# Patient Record
Sex: Male | Born: 1990 | Race: White | Hispanic: No | Marital: Single | Smoking: Never smoker
Health system: Southern US, Community
[De-identification: ages and names within clinical notes are randomized; demographics above are authoritative.]

## PROBLEM LIST (undated history)

## (undated) DIAGNOSIS — F419 Anxiety disorder, unspecified: Secondary | ICD-10-CM

---
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
## 2014-08-25 ENCOUNTER — Other Ambulatory Visit: Payer: Self-pay | Admitting: Internal Medicine

## 2014-08-25 ENCOUNTER — Ambulatory Visit
Admission: RE | Admit: 2014-08-25 | Discharge: 2014-08-25 | Disposition: A | Discharge status: Home / Self Care | Payer: No Typology Code available for payment source | Source: Ambulatory Visit | Attending: Internal Medicine | Admitting: Internal Medicine

## 2014-08-25 DIAGNOSIS — M79641 Pain in right hand: Secondary | ICD-10-CM

## 2014-08-30 ENCOUNTER — Other Ambulatory Visit (HOSPITAL_COMMUNITY): Payer: Self-pay | Admitting: Orthopedic Surgery

## 2014-08-30 DIAGNOSIS — S62316A Displaced fracture of base of fifth metacarpal bone, right hand, initial encounter for closed fracture: Secondary | ICD-10-CM

## 2014-08-31 ENCOUNTER — Ambulatory Visit (HOSPITAL_COMMUNITY)
Admission: RE | Admit: 2014-08-31 | Discharge: 2014-08-31 | Disposition: A | Discharge status: Home / Self Care | Payer: Self-pay | Source: Ambulatory Visit | Attending: Orthopedic Surgery | Admitting: Orthopedic Surgery

## 2014-08-31 ENCOUNTER — Ambulatory Visit (HOSPITAL_COMMUNITY): Payer: Self-pay

## 2014-08-31 DIAGNOSIS — S62316A Displaced fracture of base of fifth metacarpal bone, right hand, initial encounter for closed fracture: Secondary | ICD-10-CM

## 2014-08-31 DIAGNOSIS — X58XXXD Exposure to other specified factors, subsequent encounter: Secondary | ICD-10-CM | POA: Insufficient documentation

## 2014-08-31 DIAGNOSIS — S62316D Displaced fracture of base of fifth metacarpal bone, right hand, subsequent encounter for fracture with routine healing: Secondary | ICD-10-CM | POA: Insufficient documentation

## 2016-11-13 IMAGING — CR DG HAND COMPLETE 3+V*R*
3 series · 3 of 3 positions shown · non-contrast
Comparison: None in PACs

CLINICAL DATA: Punching injury 3 weeks ago with persistent pain
along the fifth metacarpal

EXAM:
RIGHT HAND - COMPLETE 3+ VIEW

[x hand pa right]
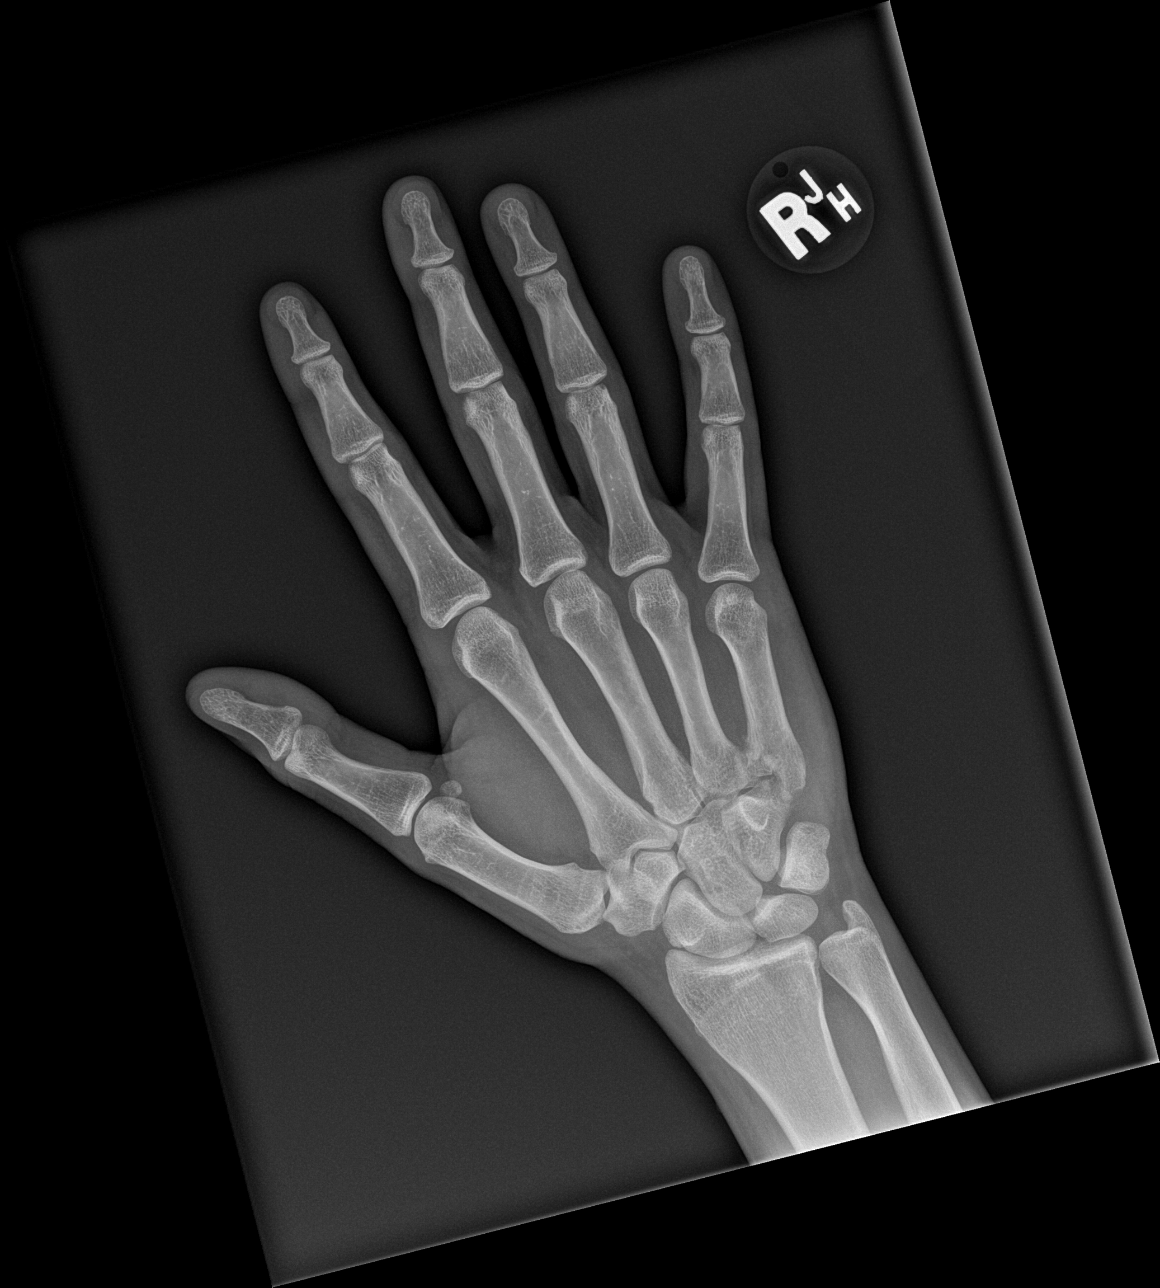

[x hand obl right]
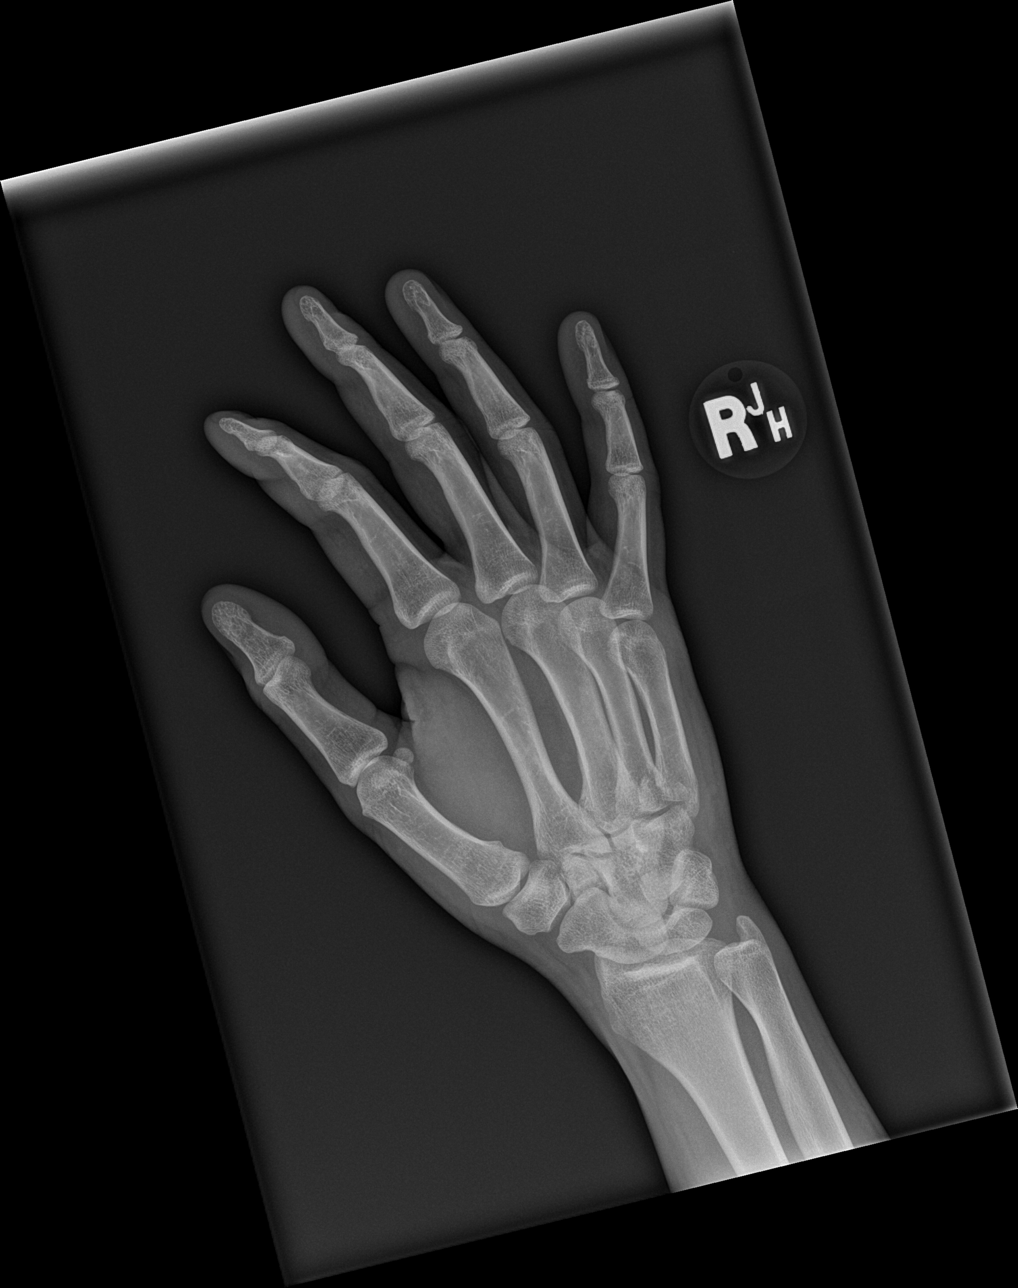

[x hand lat right]
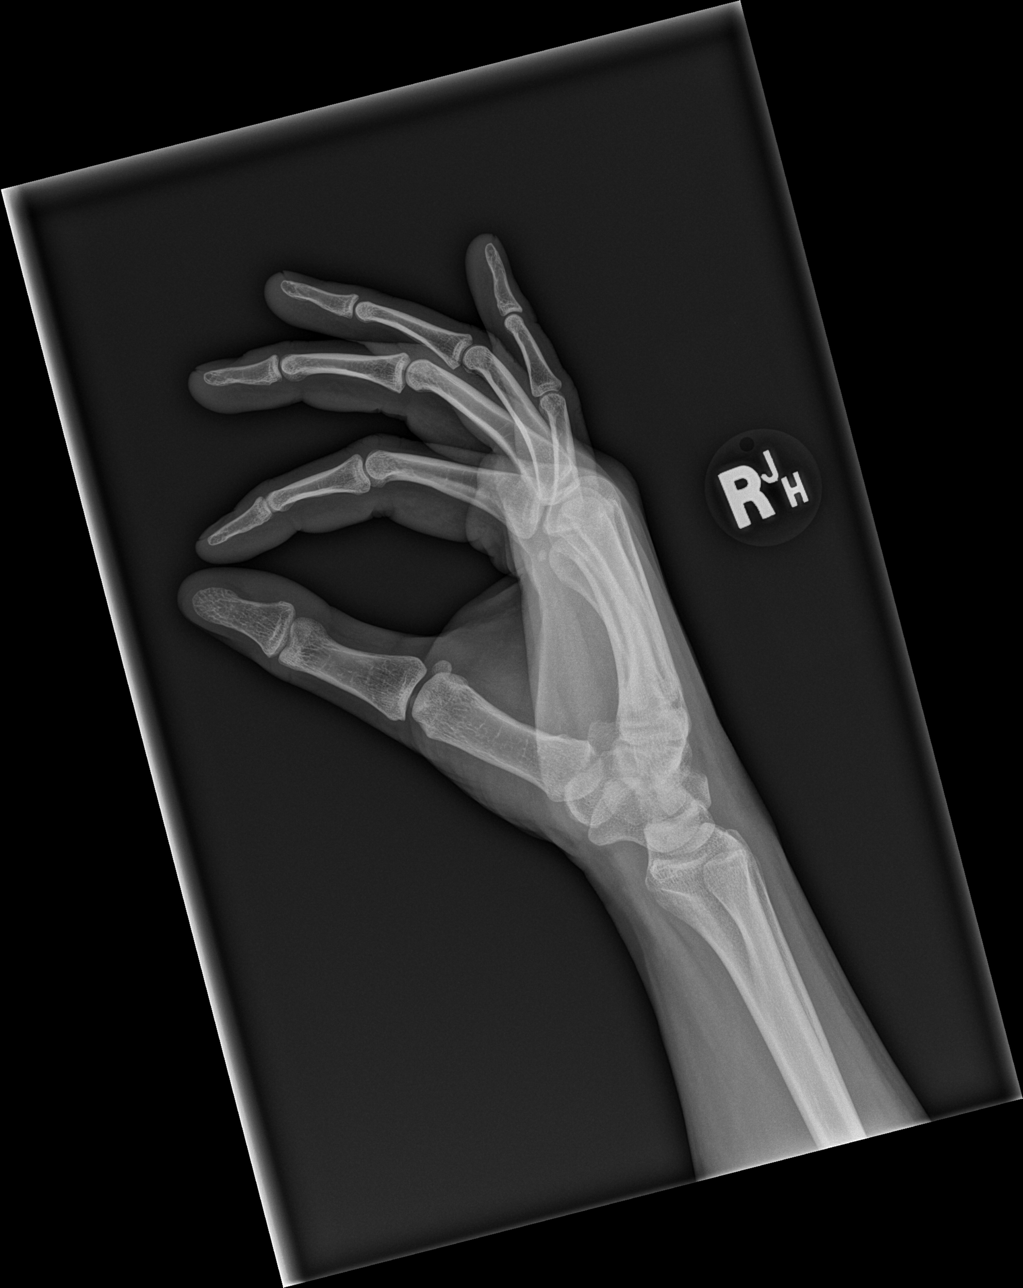

[3 of 3 positions shown; findings below may reference images not displayed]

FINDINGS: Patient has sustained a fracture involving the base of the fifth
metacarpal. There is an intra-articular component of the fracture.
More distally the fifth metacarpal is intact. The fourth first
through fourth metacarpals are intact as are the phalanges. The
carpal bones are unremarkable.
IMPRESSION: There is a comminuted intra-articular fracture of the base of the
right fifth metacarpal.

## 2016-11-19 IMAGING — CT CT 3D ACQUISTION WKST
3 series · 16 of 33 positions shown, 19 images · non-contrast
Comparison: Radiographs 08/25/2014.

CLINICAL DATA: Right hand fracture sustained 3 weeks ago. Initial
encounter.

EXAM:
CT OF THE RIGHT HAND WITHOUT CONTRAST
3-DIMENSIONAL CT IMAGE RENDERING ON ACQUISITION WORKSTATION
TECHNIQUE: Multidetector CT imaging of the right hand was performed according
to the standard protocol. Multiplanar CT image reconstructions were
also generated. 3-dimensional CT images were rendered by
post-processing of the original CT data on an acquisition
workstation. The 3-dimensional CT images were interpreted and
findings were reported in the accompanying complete CT report for
this study

[Series 4: lfov ext 3.0 b40s · axial · 0.39mm/px · z∈[-620,-422]mm · 8 of 79 slices shown, 10 images]
[im 7/79  soft-tissue]
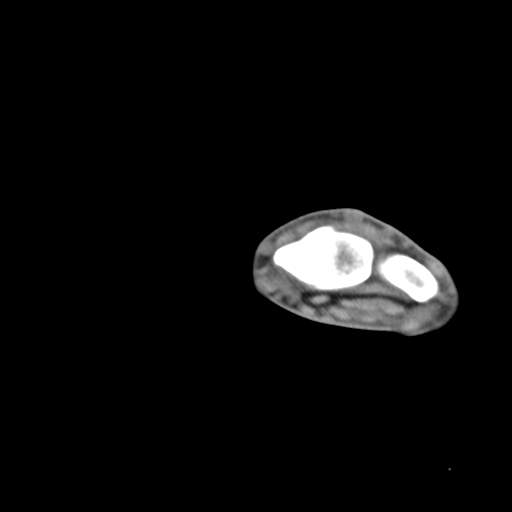
[im 7/79  bone]
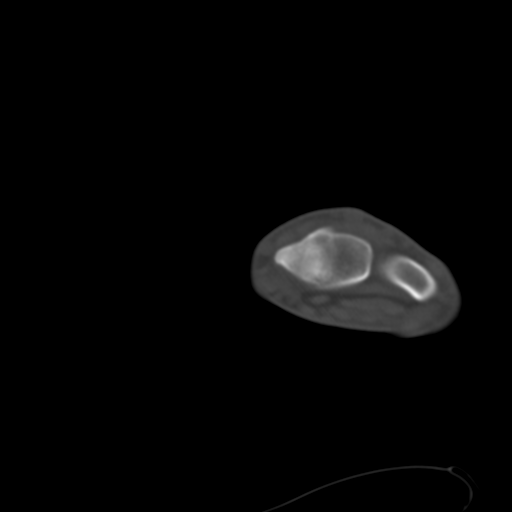
[im 19/79  bone]
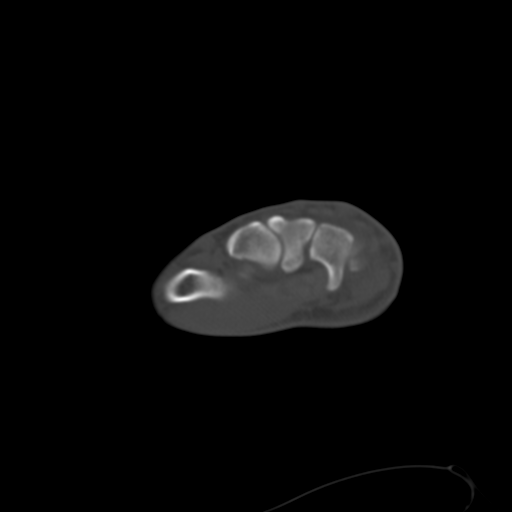
[im 25/79  bone]
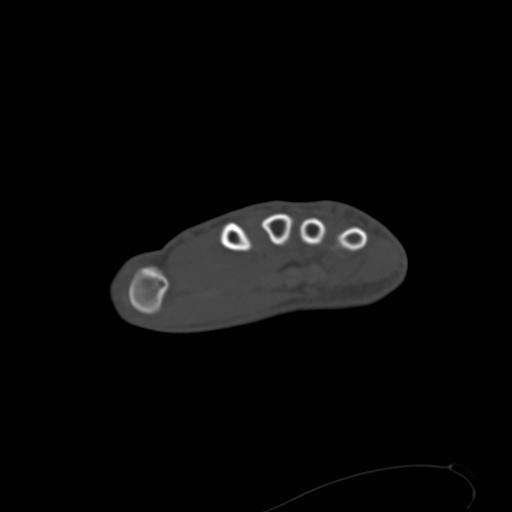
[im 37/79  bone]
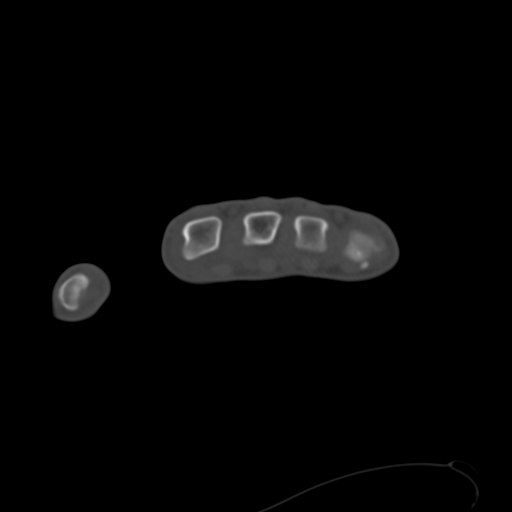
[im 43/79  soft-tissue]
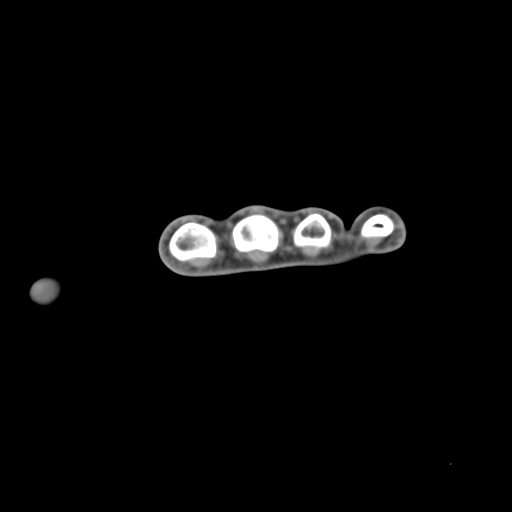
[im 43/79  bone]
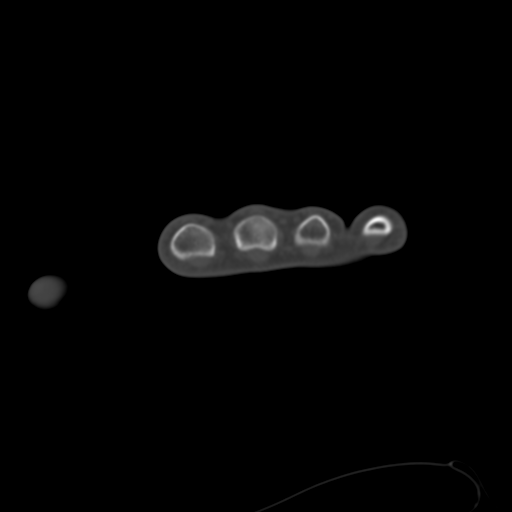
[im 55/79  bone]
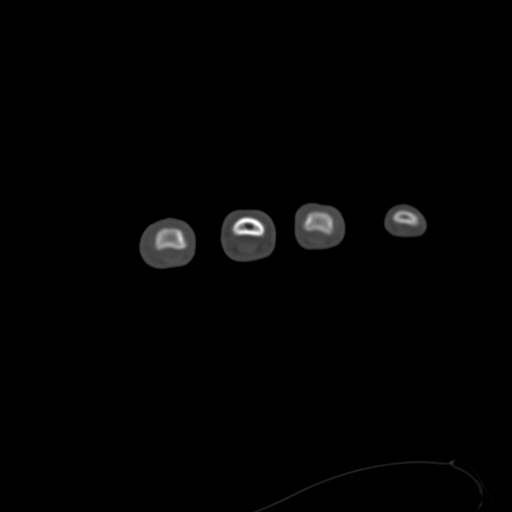
[im 61/79  bone]
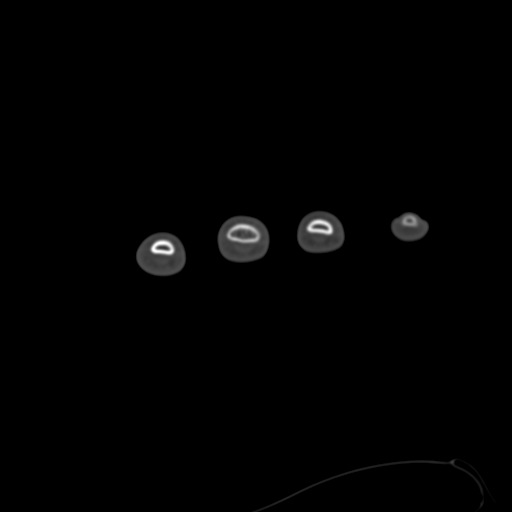
[im 73/79  bone]
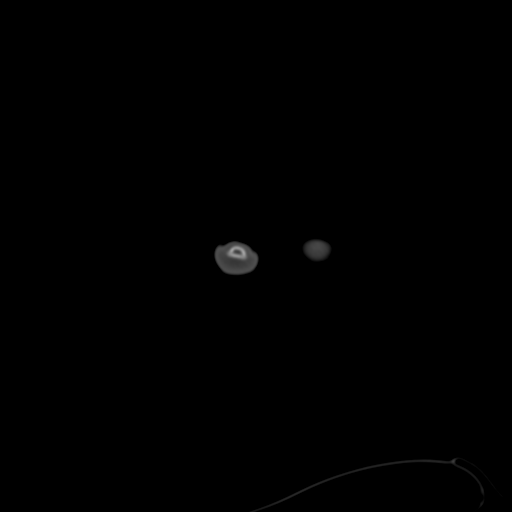

[Series 604: s.t. · coronal · 0.46mm/px · 3 of 46 slices shown]
[im 10/46  bone]
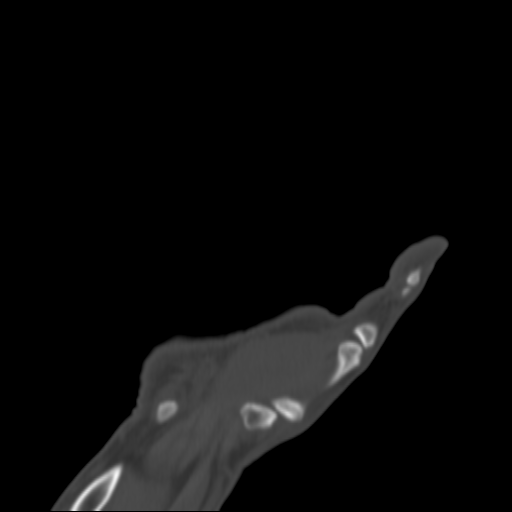
[im 19/46  bone]
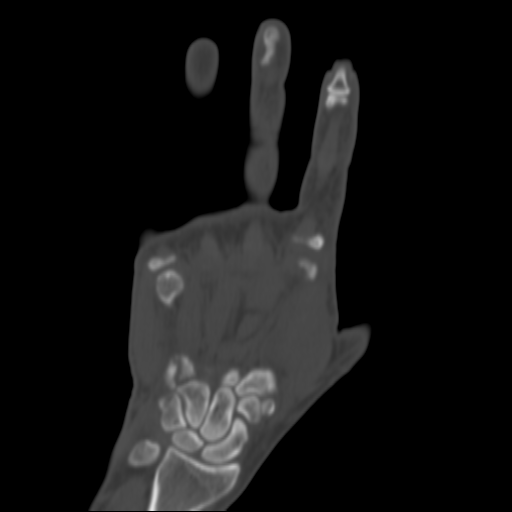
[im 28/46  bone]
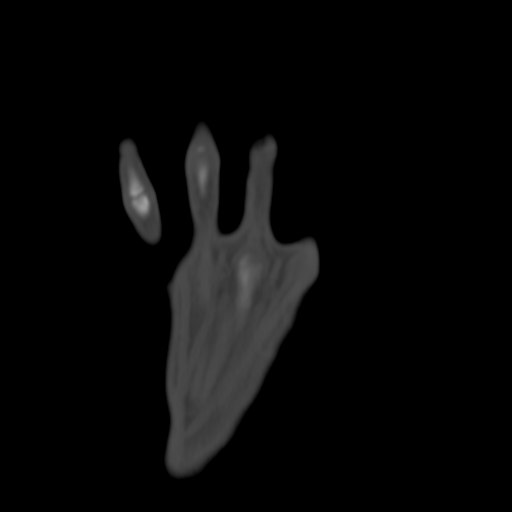

[Series 606: sag s.t. · sagittal · 0.46mm/px · 5 of 78 slices shown, 6 images]
[im 26/78  bone]
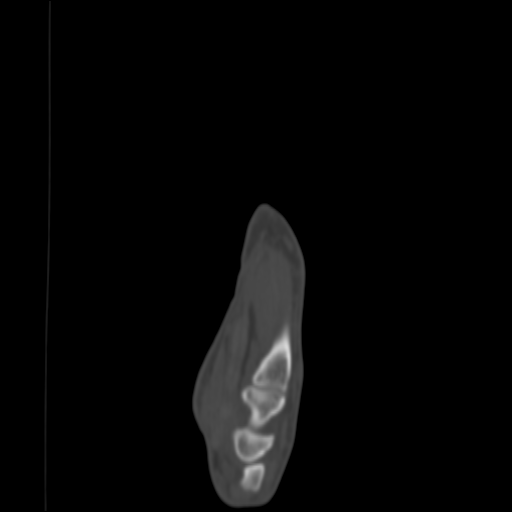
[im 33/78  bone]
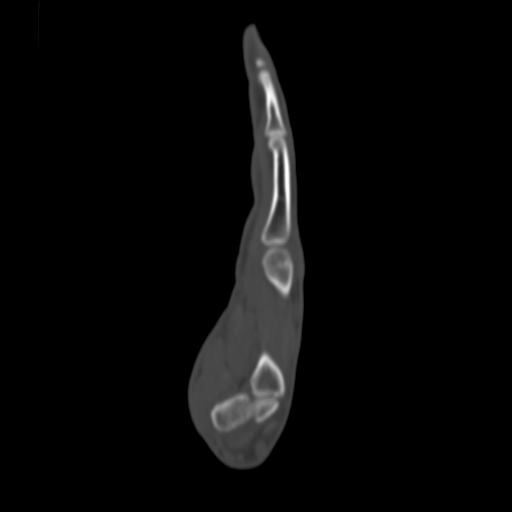
[im 39/78  soft-tissue]
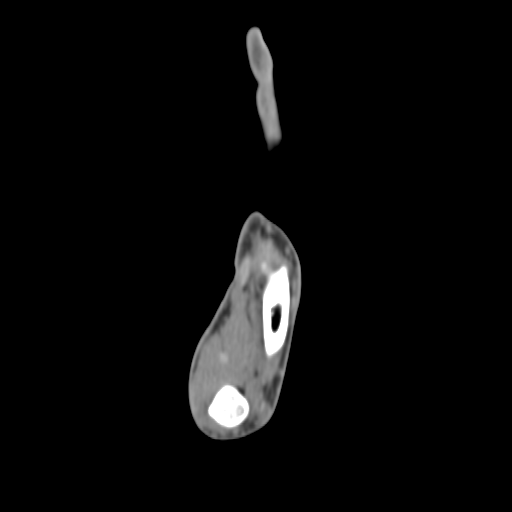
[im 39/78  bone]
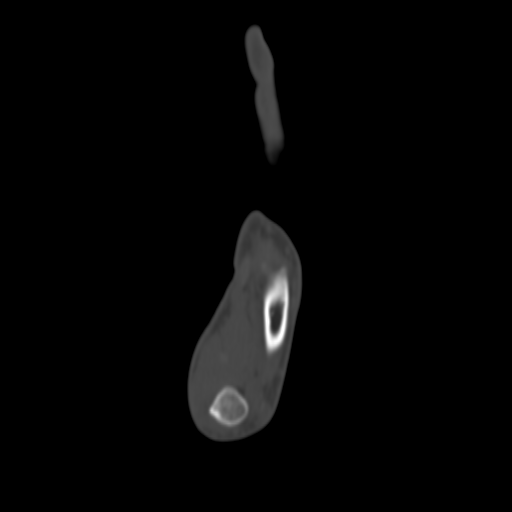
[im 45/78  bone]
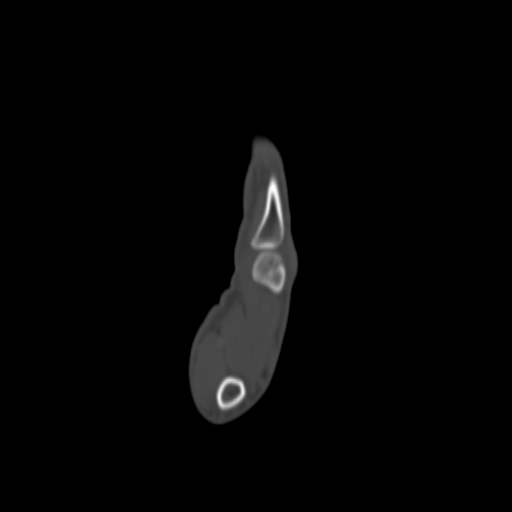
[im 52/78  bone]
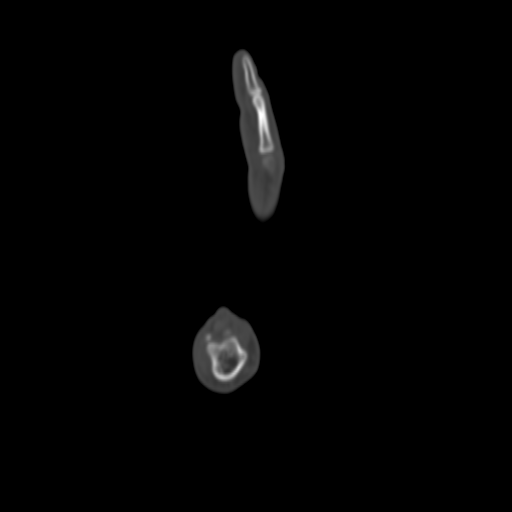

[16 of 33 positions shown; findings below may reference images not displayed]

FINDINGS: Comminuted intra-articular fracture involving the volar base of the
fifth metacarpal is again noted. This demonstrates up to 4 mm of
displacement, best seen on the coronal images.

There is no evidence of adjacent fracture of the hamate. The hamate
hook is intact. The additional metacarpal bases are intact. The
additional carpal bones and phalanges are intact and normally
aligned.

No tendon entrapment or ligamentous abnormality identified.
IMPRESSION: 1. Stable comminuted intra-articular fracture involving the fifth
metacarpal base.
2. No other significant findings within the right hand.

## 2019-01-05 ENCOUNTER — Ambulatory Visit: Payer: BLUE CROSS/BLUE SHIELD | Attending: Internal Medicine

## 2019-01-05 DIAGNOSIS — U071 COVID-19: Secondary | ICD-10-CM

## 2019-01-05 DIAGNOSIS — R238 Other skin changes: Secondary | ICD-10-CM | POA: Insufficient documentation

## 2019-01-05 DIAGNOSIS — Z20828 Contact with and (suspected) exposure to other viral communicable diseases: Secondary | ICD-10-CM | POA: Insufficient documentation

## 2019-01-07 LAB — NOVEL CORONAVIRUS, NAA: SARS-CoV-2, NAA: NOT DETECTED

## 2019-01-20 ENCOUNTER — Telehealth: Payer: Self-pay | Admitting: Internal Medicine

## 2019-01-20 NOTE — Telephone Encounter (Signed)
I called pt back who is pt of Dr Ludwig Clarks who states that he had labs done yesterday and has not heard back from those results and he is still having pain on his lower back with intermittent sharp pains with bending over or standing up after sitting. He denies injuring himself the day pain started. Had a spider bite 3 days prior and he drained poss and blood from this area which is better.  Pain level at rest 2-/3/10. With movement 7/10. He saw his PCP on 1/8 and mentioned something about his kidneys. Today he has had chills and sweats off and on while at work, but does not know if he had a fever. He does not have body aches or UTI symptoms, bloody urine or blood or brown urine.  Has a healing spider bide on area of pain which is healing and denies having any blisters on this area.  I looked for the results in epic and they are not present. I explained to pt that he may not use epic to have his labs logged in, but is not there.  In the mean time, I advised him if he gets worse to go to urgent care today. Otherwise try ice and heat on area of pain and do stretches of his back after the heat. Also may take Ibuprofen or aleve prn pain. Needs to call his PCP in the am.

## 2019-02-09 ENCOUNTER — Ambulatory Visit: Payer: BLUE CROSS/BLUE SHIELD | Attending: Internal Medicine

## 2019-02-09 DIAGNOSIS — Z20822 Contact with and (suspected) exposure to covid-19: Secondary | ICD-10-CM | POA: Insufficient documentation

## 2019-02-10 LAB — NOVEL CORONAVIRUS, NAA: SARS-CoV-2, NAA: NOT DETECTED

## 2019-03-27 ENCOUNTER — Ambulatory Visit: Payer: BLUE CROSS/BLUE SHIELD | Attending: Internal Medicine

## 2019-03-27 DIAGNOSIS — Z20822 Contact with and (suspected) exposure to covid-19: Secondary | ICD-10-CM | POA: Insufficient documentation

## 2019-03-28 LAB — NOVEL CORONAVIRUS, NAA: SARS-CoV-2, NAA: NOT DETECTED

## 2023-10-01 ENCOUNTER — Ambulatory Visit (INDEPENDENT_AMBULATORY_CARE_PROVIDER_SITE_OTHER): Admit: 2023-10-01 | Discharge: 2023-10-01 | Disposition: A | Discharge status: Home / Self Care | Admitting: Radiology

## 2023-10-01 ENCOUNTER — Encounter (HOSPITAL_BASED_OUTPATIENT_CLINIC_OR_DEPARTMENT_OTHER): Payer: Self-pay

## 2023-10-01 ENCOUNTER — Ambulatory Visit (HOSPITAL_BASED_OUTPATIENT_CLINIC_OR_DEPARTMENT_OTHER): Admission: EM | Admit: 2023-10-01 | Discharge: 2023-10-01 | Disposition: A | Discharge status: Home / Self Care

## 2023-10-01 ENCOUNTER — Other Ambulatory Visit (HOSPITAL_BASED_OUTPATIENT_CLINIC_OR_DEPARTMENT_OTHER): Payer: Self-pay

## 2023-10-01 DIAGNOSIS — M545 Low back pain, unspecified: Secondary | ICD-10-CM

## 2023-10-01 DIAGNOSIS — M5431 Sciatica, right side: Secondary | ICD-10-CM

## 2023-10-01 HISTORY — DX: Anxiety disorder, unspecified: F41.9

## 2023-10-01 MED ORDER — CYCLOBENZAPRINE HCL 10 MG PO TABS
10.0000 mg | ORAL_TABLET | Freq: Two times a day (BID) | ORAL | 0 refills | Status: AC | PRN
  Filled 2023-10-01: qty 30, 15d supply, fill #0

## 2023-10-01 MED ORDER — TRIAMCINOLONE ACETONIDE 40 MG/ML IJ SUSP
40.0000 mg | Freq: Once | INTRAMUSCULAR | Status: AC
  Administered 2023-10-01: 40 mg via INTRAMUSCULAR

## 2023-10-01 MED ORDER — PREDNISONE 10 MG (21) PO TBPK
ORAL_TABLET | ORAL | 0 refills | Status: AC
  Filled 2023-10-01: qty 21, 6d supply, fill #0

## 2023-10-01 NOTE — Discharge Instructions (Signed)
 I am treating you for sciatic nerve pain.  I have prescribed prednisone  pack to take tapered over the next 6 days.  We also gave you a steroid injection today to help with pain and inflammation.  Flexeril  as needed for muscle relaxation. Information given on sciatic nerve pain and exercises. Follow-up with your doctor for any continued issues

## 2023-10-01 NOTE — ED Triage Notes (Signed)
 Pt c/o a constant sharp, ache, and tight pain located in the right low back and radiates down his leg for the last 2 days. Pain is worst when he is standing straight up. He has seen a chiropractor twice since the pain started for decompression with no relief. Pt has taken flexeril  with no relief.

## 2023-10-01 NOTE — ED Provider Notes (Signed)
 [facility]    CSN: [id number] Arrival date & time: 10/01/23  1607      History   Chief Complaint Chief Complaint  Patient presents with   Back Pain    HPI Richard Gibson is a 33 y.o. male.   Pt c/o a constant sharp, ache, and tight pain located in the right low back and radiates down his leg for the last 2 days. Pain is worst when he is standing straight up. He has seen a chiropractor twice since the pain started for decompression with no relief. Pt has taken flexeril  with no relief.     Back Pain   Past Medical History:  Diagnosis Date   Anxiety     There are no active problems to display for this patient.   History reviewed. No pertinent surgical history.     Home Medications    Prior to Admission medications   Medication Sig Start Date End Date Taking? Authorizing Provider  cyclobenzaprine  (FLEXERIL ) 10 MG tablet Take 10 mg by mouth as needed for muscle spasms. 05/01/23  Yes [provider]  zolpidem (AMBIEN) 10 MG tablet Take 10 mg by mouth at bedtime as needed. 09/02/23  Yes [provider]  PARoxetine (PAXIL) 10 MG tablet Take 10 mg by mouth daily.    [provider]    Family History History reviewed. No pertinent family history.  Social History Social History   Tobacco Use   Smoking status: Never   Smokeless tobacco: Never  Vaping Use   Vaping status: Never Used  Substance Use Topics   Alcohol use: Not Currently   Drug use: Not Currently     Allergies   Patient has no known allergies.   Review of Systems Review of Systems  Musculoskeletal:  Positive for back pain.     Physical Exam Triage Vital Signs ED Triage Vitals  Encounter Vitals Group     BP 10/01/23 1625 (!) 153/85     Girls Systolic BP Percentile --      Girls Diastolic BP Percentile --      Boys Systolic BP Percentile --      Boys Diastolic BP Percentile --      Pulse Rate 10/01/23 1625 88     Resp 10/01/23 1625 20     Temp 10/01/23  1625 98.6 F (37 C)     Temp Source 10/01/23 1625 Oral     SpO2 10/01/23 1625 98 %     Weight --      Height --      Head Circumference --      Peak Flow --      Pain Score 10/01/23 1621 8     Pain Loc --      Pain Education --      Exclude from Growth Chart --    No data found.  Updated Vital Signs BP (!) 153/85 (BP Location: Right Arm)   Pulse 88   Temp 98.6 F (37 C) (Oral)   Resp 20   SpO2 98%   Visual Acuity Right Eye Distance:   Left Eye Distance:   Bilateral Distance:    Right Eye Near:   Left Eye Near:    Bilateral Near:     Physical Exam   UC Treatments / Results  Labs (all labs ordered are listed, but only abnormal results are displayed) Labs Reviewed - No data to display  EKG   Radiology DG Lumbar Spine Complete Result Date: 10/01/2023 CLINICAL  DATA:  Back pain EXAM: LUMBAR SPINE - COMPLETE 4+ VIEW COMPARISON:  Report only MRI lumbar spine 08/11/2019. FINDINGS: There is no evidence of lumbar spine fracture. Alignment is normal. Intervertebral disc spaces are maintained. Anterior endplate osteophyte is seen along the superior endplate of L4. IMPRESSION: 1. No acute fracture or malalignment. 2. Mild degenerative changes at L3-L4. Electronically Signed   By: Greig Pique M.D.   On: 10/01/2023 17:14    Procedures Procedures (including critical care time)  Medications Ordered in UC Medications - No data to display  Initial Impression / Assessment and Plan / UC Course  I have reviewed the triage vital signs and the nursing notes.  Pertinent labs & imaging results that were available during my care of the patient were reviewed by me and considered in my medical decision making (see chart for details).     *** Final Clinical Impressions(s) / UC Diagnoses   Final diagnoses:  Acute right-sided low back pain, unspecified whether sciatica present   Discharge Instructions   None    ED Prescriptions   None    PDMP not reviewed this encounter.
